# Patient Record
Sex: Male | Born: 1990 | Race: White | Hispanic: No | Marital: Single | Smoking: Never smoker
Health system: Southern US, Community
[De-identification: ages and names within clinical notes are randomized; demographics above are authoritative.]

---

## 2014-07-28 ENCOUNTER — Emergency Department (HOSPITAL_COMMUNITY): Payer: BC Managed Care – PPO

## 2014-07-28 ENCOUNTER — Encounter (HOSPITAL_COMMUNITY): Payer: Self-pay

## 2014-07-28 ENCOUNTER — Emergency Department (HOSPITAL_COMMUNITY)
Admission: EM | Admit: 2014-07-28 | Discharge: 2014-07-28 | Disposition: A | Discharge status: Home / Self Care | Payer: BC Managed Care – PPO | Attending: Emergency Medicine | Admitting: Emergency Medicine

## 2014-07-28 DIAGNOSIS — Y92321 Football field as the place of occurrence of the external cause: Secondary | ICD-10-CM | POA: Insufficient documentation

## 2014-07-28 DIAGNOSIS — S8011XA Contusion of right lower leg, initial encounter: Secondary | ICD-10-CM

## 2014-07-28 DIAGNOSIS — S8991XA Unspecified injury of right lower leg, initial encounter: Secondary | ICD-10-CM | POA: Diagnosis present

## 2014-07-28 DIAGNOSIS — Y998 Other external cause status: Secondary | ICD-10-CM | POA: Diagnosis not present

## 2014-07-28 DIAGNOSIS — W51XXXA Accidental striking against or bumped into by another person, initial encounter: Secondary | ICD-10-CM | POA: Insufficient documentation

## 2014-07-28 DIAGNOSIS — Y9361 Activity, american tackle football: Secondary | ICD-10-CM | POA: Insufficient documentation

## 2014-07-28 MED ORDER — HYDROCODONE-ACETAMINOPHEN 5-325 MG PO TABS: 2.0000 | ORAL_TABLET | ORAL | Status: AC | PRN

## 2014-07-28 NOTE — ED Notes (Signed)
Pt presents with c/o right leg injury that occurred approx one hour ago. Pt reports he was playing football and got kneed in the leg. Pt has a large contusion to lower right leg, ambulatory to triage.

## 2014-07-28 NOTE — ED Provider Notes (Signed)
CSN: [id number]     Arrival date & time 07/28/14  1500 History   First MD Initiated Contact with Patient 07/28/14 1534     Chief Complaint  Patient presents with  . Leg Injury   Pt reports another   (Consider location/radiation/quality/duration/timing/severity/associated sxs/prior Treatment) Patient is a 23 y.o. male presenting with leg pain. The history is provided by the patient. No language interpreter was used.  Leg Pain Location:  Leg Injury: no   Leg location:  R leg and R lower leg Pain details:    Quality:  Aching   Radiates to:  Does not radiate   Severity:  No pain   Onset quality:  Gradual   Timing:  Constant   Progression:  Worsening Chronicity:  New Dislocation: no   Foreign body present:  No foreign bodies Tetanus status:  Up to date Prior injury to area:  No Relieved by:  Nothing Worsened by:  Nothing tried Ineffective treatments:  None tried Risk factors: no concern for non-accidental trauma     History reviewed. No pertinent past medical history. History reviewed. No pertinent past surgical history. No family history on file. History  Substance Use Topics  . Smoking status: Never Smoker   . Smokeless tobacco: Not on file  . Alcohol Use: Yes     Comment: rarely     Review of Systems  Musculoskeletal: Positive for joint swelling and arthralgias.  Skin: Positive for wound.  All other systems reviewed and are negative.     Allergies  Review of patient's allergies indicates no known allergies.  Home Medications   Prior to Admission medications   Not on File   BP 121/83 mmHg  Pulse 103  Temp(Src) 98 F (36.7 C) (Oral)  Resp 18  SpO2 99% Physical Exam  Constitutional: He appears well-developed and well-nourished.  Cardiovascular: Normal rate.   Musculoskeletal: He exhibits tenderness.  Abrasion right outer leg,  Swollen tender,  Pain with range of motion  nv and ns intact  Neurological: He is alert.  Skin: Skin is warm.  Psychiatric:  He has a normal mood and affect.  Nursing note and vitals reviewed.   ED Course  Procedures (including critical care time) Labs Review Labs Reviewed - No data to display  Imaging Review Dg Tibia/fibula Right  07/28/2014   CLINICAL DATA:  Football injury. Swelling to the medial anterior knee.  EXAM: RIGHT TIBIA AND FIBULA - 2 VIEW  COMPARISON:  None.  FINDINGS: There is no evidence of fracture or other focal bone lesions. Soft tissues are unremarkable.  IMPRESSION: Negative.   Electronically Signed   By: Richarda OverlieAdam  Henn M.D.   On: 07/28/2014 16:11     EKG Interpretation None      MDM   Final diagnoses:  Contusion of right leg, initial encounter    Ace wrap Hydrocodone    Elson AreasLeslie K Dacoda Finlay, PA-C 07/28/14 1758  Lonia SkinnerLeslie K [place] ISofia, PA-C 07/28/14 1758  Warnell Foresterrey Wofford, MD 07/30/14 [id number]

## 2014-07-28 NOTE — Discharge Instructions (Signed)
Contusion °A contusion is a deep bruise. Contusions are the result of an injury that caused bleeding under the skin. The contusion may turn blue, purple, or yellow. Minor injuries will give you a painless contusion, but more severe contusions may stay painful and swollen for a few weeks.  °CAUSES  °A contusion is usually caused by a blow, trauma, or direct force to an area of the body. °SYMPTOMS  °· Swelling and redness of the injured area. °· Bruising of the injured area. °· Tenderness and soreness of the injured area. °· Pain. °DIAGNOSIS  °The diagnosis can be made by taking a history and physical exam. An X-ray, CT scan, or MRI may be needed to determine if there were any associated injuries, such as fractures. °TREATMENT  °Specific treatment will depend on what area of the body was injured. In general, the best treatment for a contusion is resting, icing, elevating, and applying cold compresses to the injured area. Over-the-counter medicines may also be recommended for pain control. Ask your caregiver what the best treatment is for your contusion. °HOME CARE INSTRUCTIONS  °· Put ice on the injured area. °¨ Put ice in a plastic bag. °¨ Place a towel between your skin and the bag. °¨ Leave the ice on for 15-20 minutes, 3-4 times a day, or as directed by your health care provider. °· Only take over-the-counter or prescription medicines for pain, discomfort, or fever as directed by your caregiver. Your caregiver may recommend avoiding anti-inflammatory medicines (aspirin, ibuprofen, and naproxen) for 48 hours because these medicines may increase bruising. °· Rest the injured area. °· If possible, elevate the injured area to reduce swelling. °SEEK IMMEDIATE MEDICAL CARE IF:  °· You have increased bruising or swelling. °· You have pain that is getting worse. °· Your swelling or pain is not relieved with medicines. °MAKE SURE YOU:  °· Understand these instructions. °· Will watch your condition. °· Will get help right  away if you are not doing well or get worse. °Document Released: 05/26/2005 Document Revised: 08/21/2013 Document Reviewed: 06/21/2011 °[employer]® Patient Information ©2015 [employer]. This information is not intended to replace advice given to you by your health care provider. Make sure you discuss any questions you have with your health care provider. ° °

## 2015-04-07 IMAGING — CR DG TIBIA/FIBULA 2V*R*
2 series · 2 of 2 positions shown · non-contrast
Comparison: None.

CLINICAL DATA: Football injury. Swelling to the medial anterior
knee.

EXAM:
RIGHT TIBIA AND FIBULA - 2 VIEW

[x tib-fib ap right (1 of 2)]
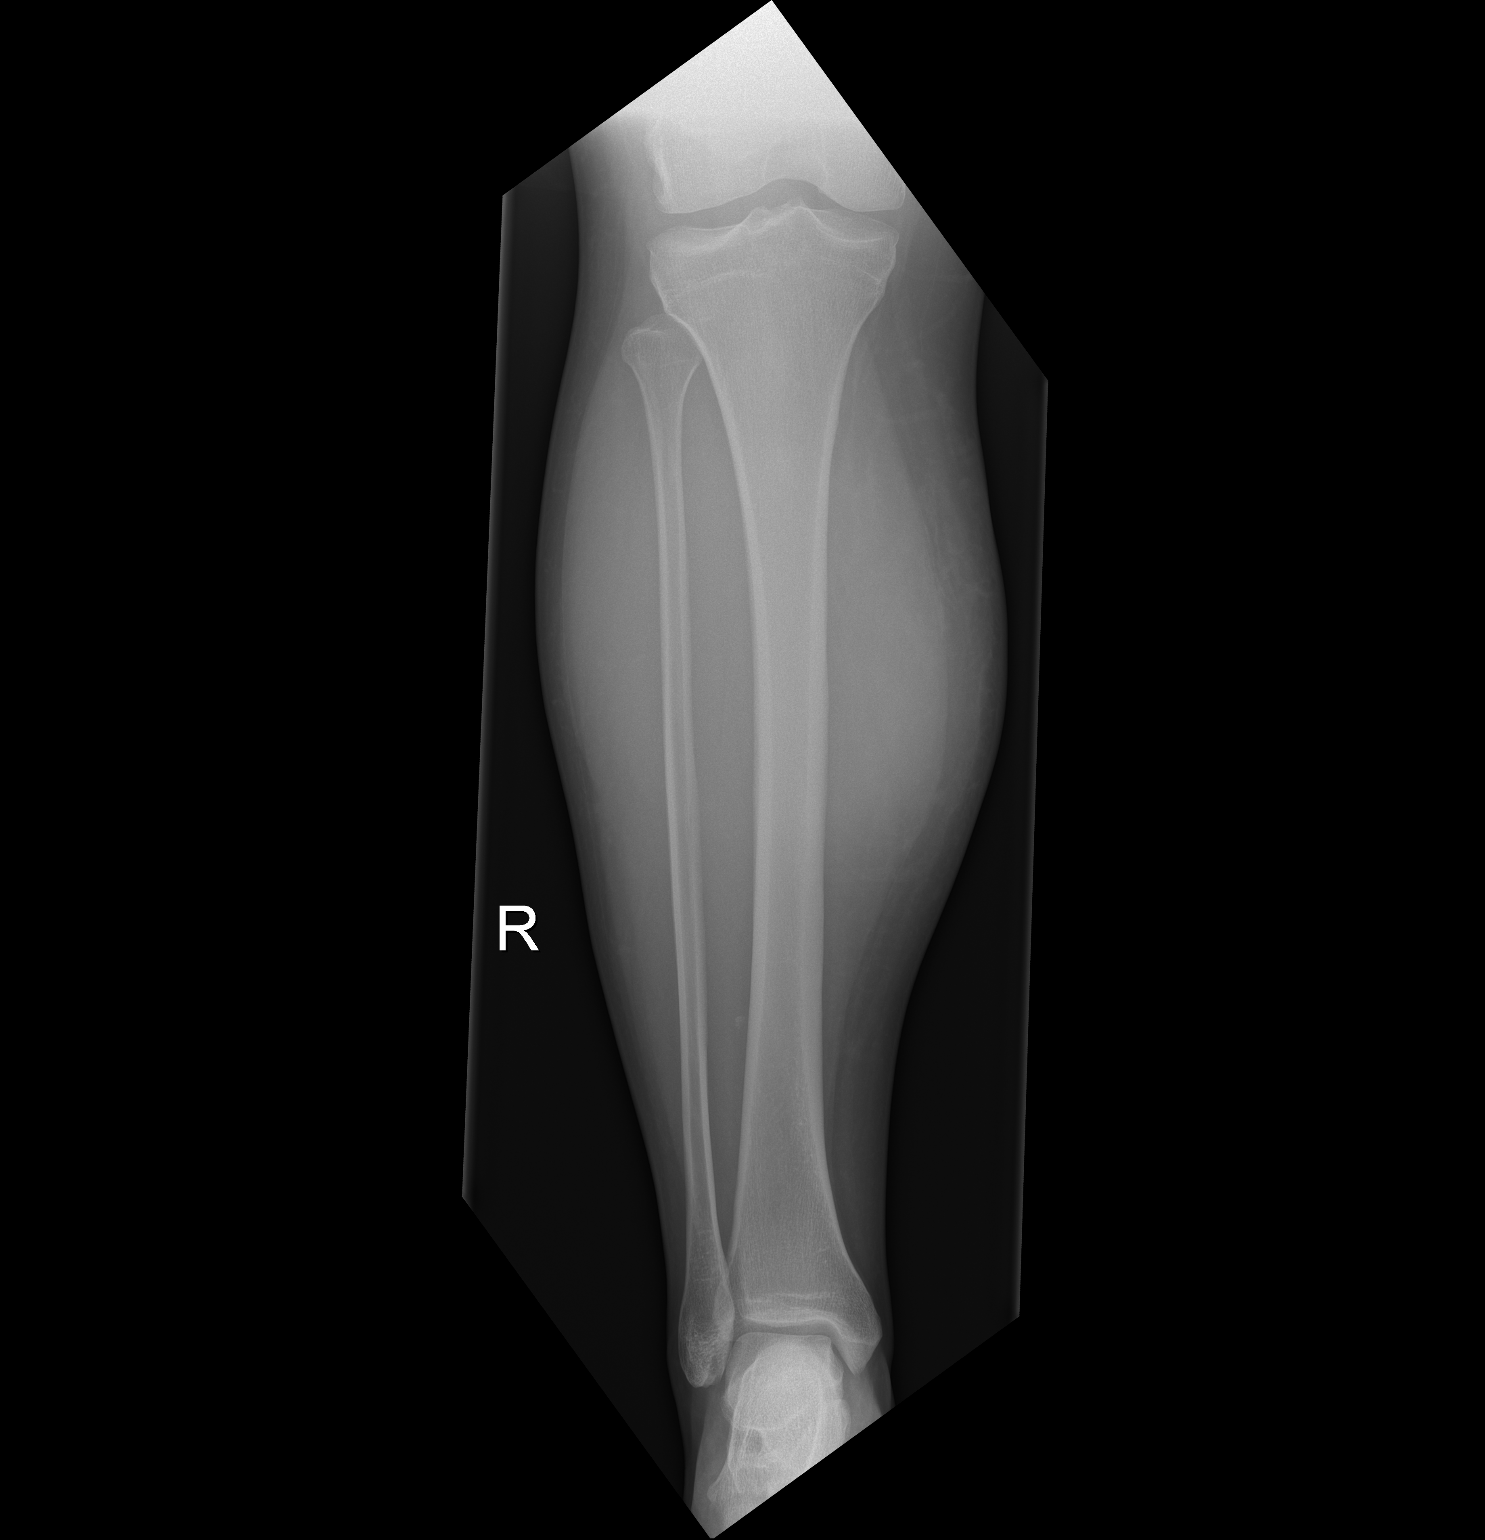

[x tib-fib ap right (2 of 2)]
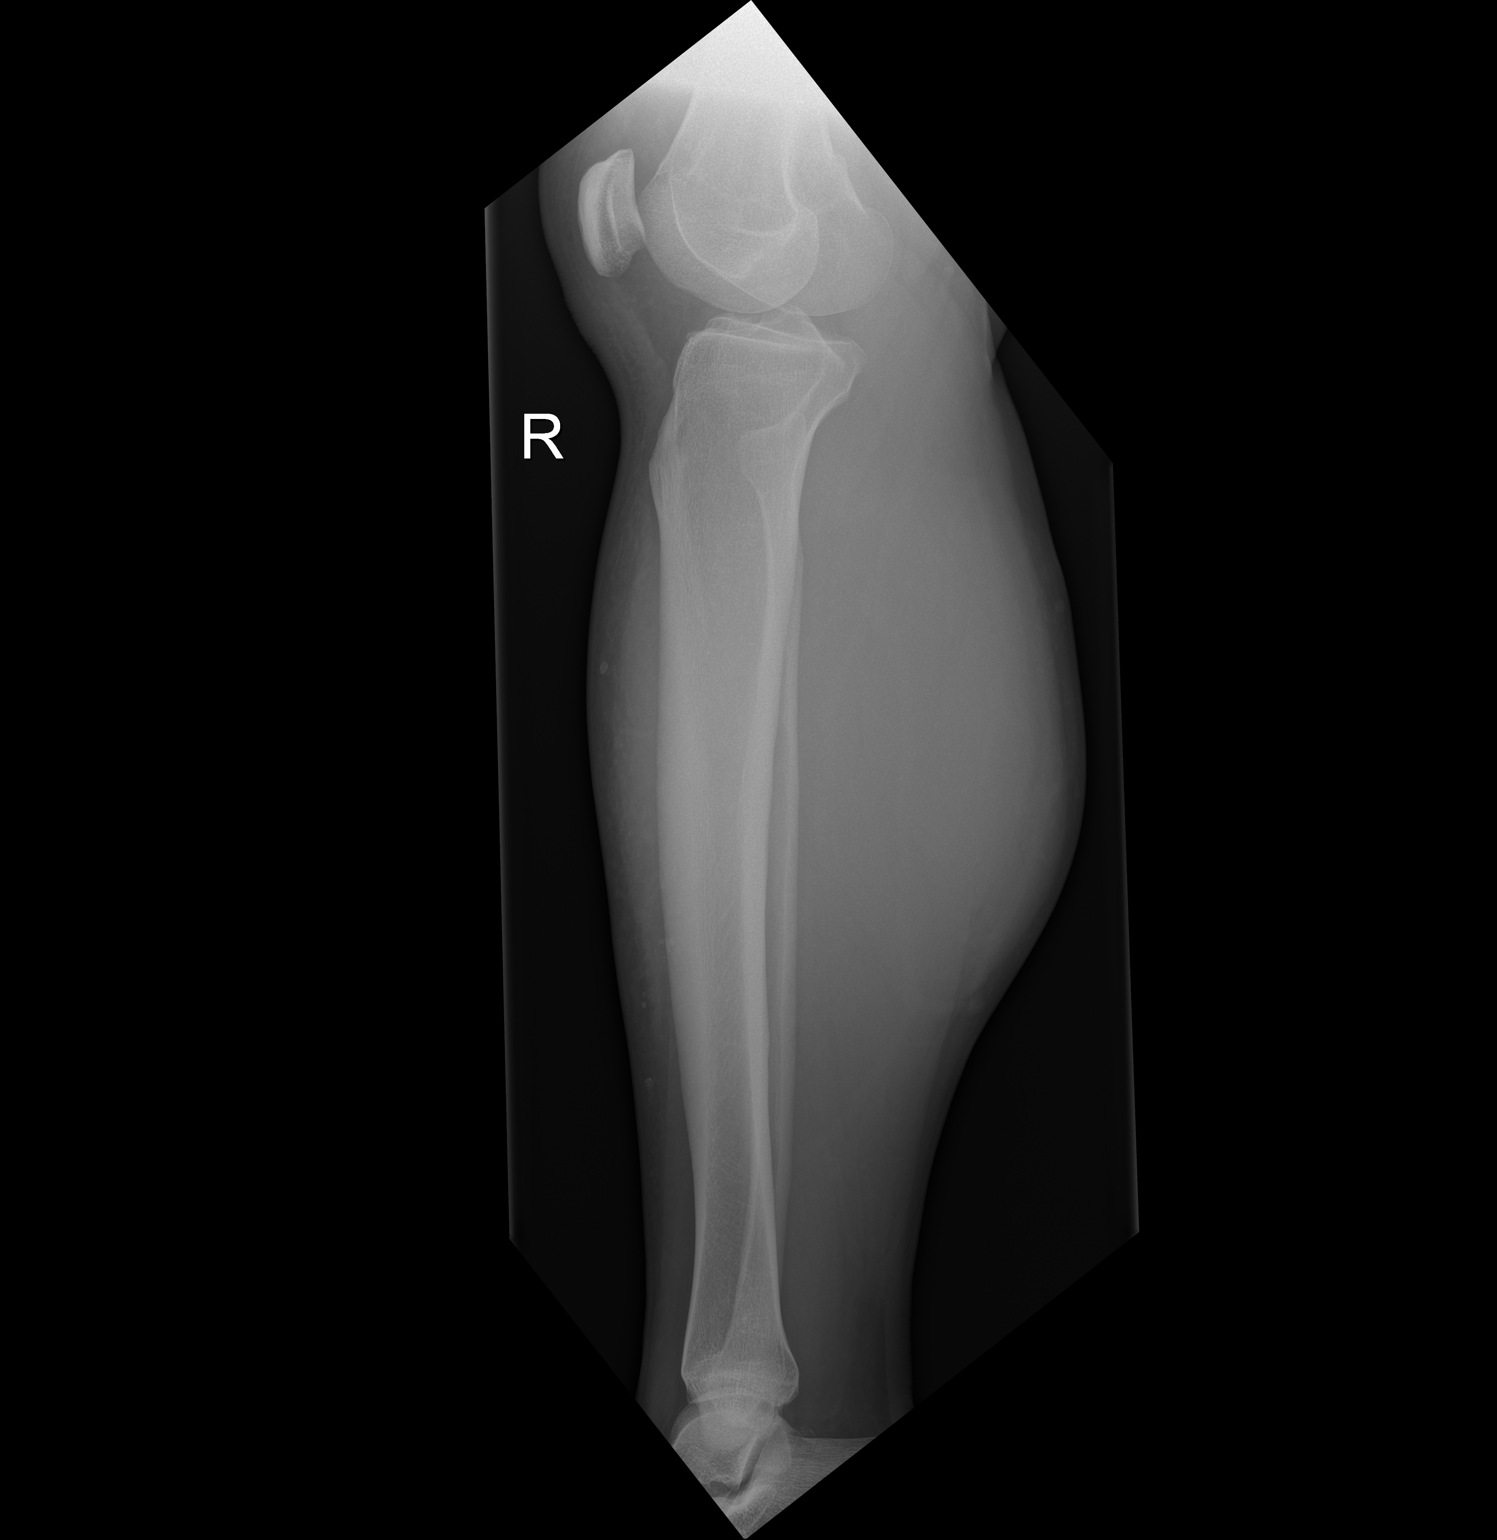

[2 of 2 positions shown; findings below may reference images not displayed]

FINDINGS: There is no evidence of fracture or other focal bone lesions. Soft
tissues are unremarkable.
IMPRESSION: Negative.

## 2017-11-08 ENCOUNTER — Ambulatory Visit: Payer: Self-pay | Admitting: Cardiology
